# Patient Record
Sex: Male | Born: 1990 | Race: Black or African American | Hispanic: No | Marital: Single | Smoking: Never smoker
Health system: Southern US, Community
[De-identification: ages and names within clinical notes are randomized; demographics above are authoritative.]

---

## 2010-05-07 ENCOUNTER — Inpatient Hospital Stay (INDEPENDENT_AMBULATORY_CARE_PROVIDER_SITE_OTHER)
Admission: RE | Admit: 2010-05-07 | Discharge: 2010-05-07 | Disposition: A | Discharge status: Home / Self Care | Source: Ambulatory Visit | Attending: Family Medicine | Admitting: Family Medicine

## 2010-05-07 DIAGNOSIS — J029 Acute pharyngitis, unspecified: Secondary | ICD-10-CM

## 2010-05-07 DIAGNOSIS — J019 Acute sinusitis, unspecified: Secondary | ICD-10-CM

## 2012-11-29 ENCOUNTER — Ambulatory Visit
Admission: RE | Admit: 2012-11-29 | Discharge: 2012-11-29 | Disposition: A | Discharge status: Home / Self Care | Source: Ambulatory Visit | Attending: Emergency Medicine | Admitting: Emergency Medicine

## 2012-11-29 ENCOUNTER — Other Ambulatory Visit: Payer: Self-pay | Admitting: Emergency Medicine

## 2012-11-29 DIAGNOSIS — S93401A Sprain of unspecified ligament of right ankle, initial encounter: Secondary | ICD-10-CM

## 2015-05-31 IMAGING — CR DG ANKLE COMPLETE 3+V*R*
3 series · 3 of 3 positions shown · non-contrast
Comparison: None.

CLINICAL DATA: Twisting injury [REDACTED]. Medial pain.

EXAM:
RIGHT ANKLE - COMPLETE 3+ VIEW

[view not recorded (1 of 3)]
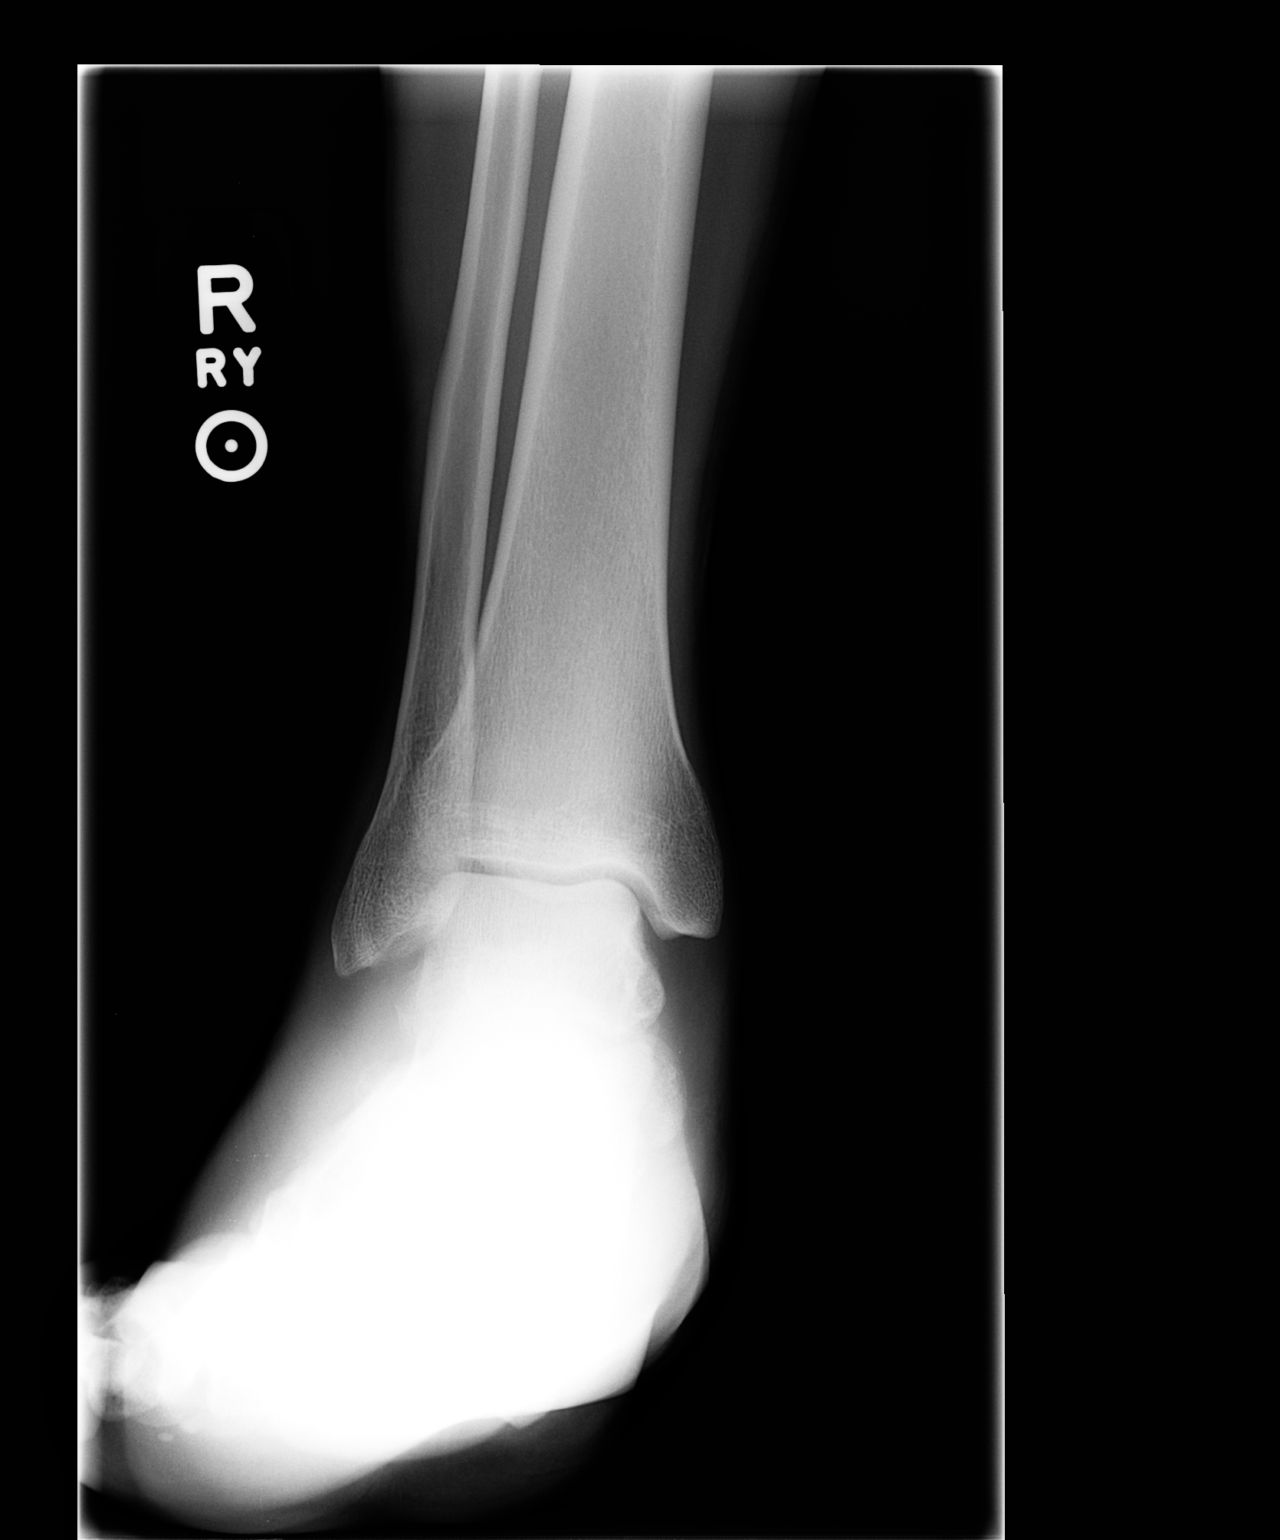

[view not recorded (2 of 3)]
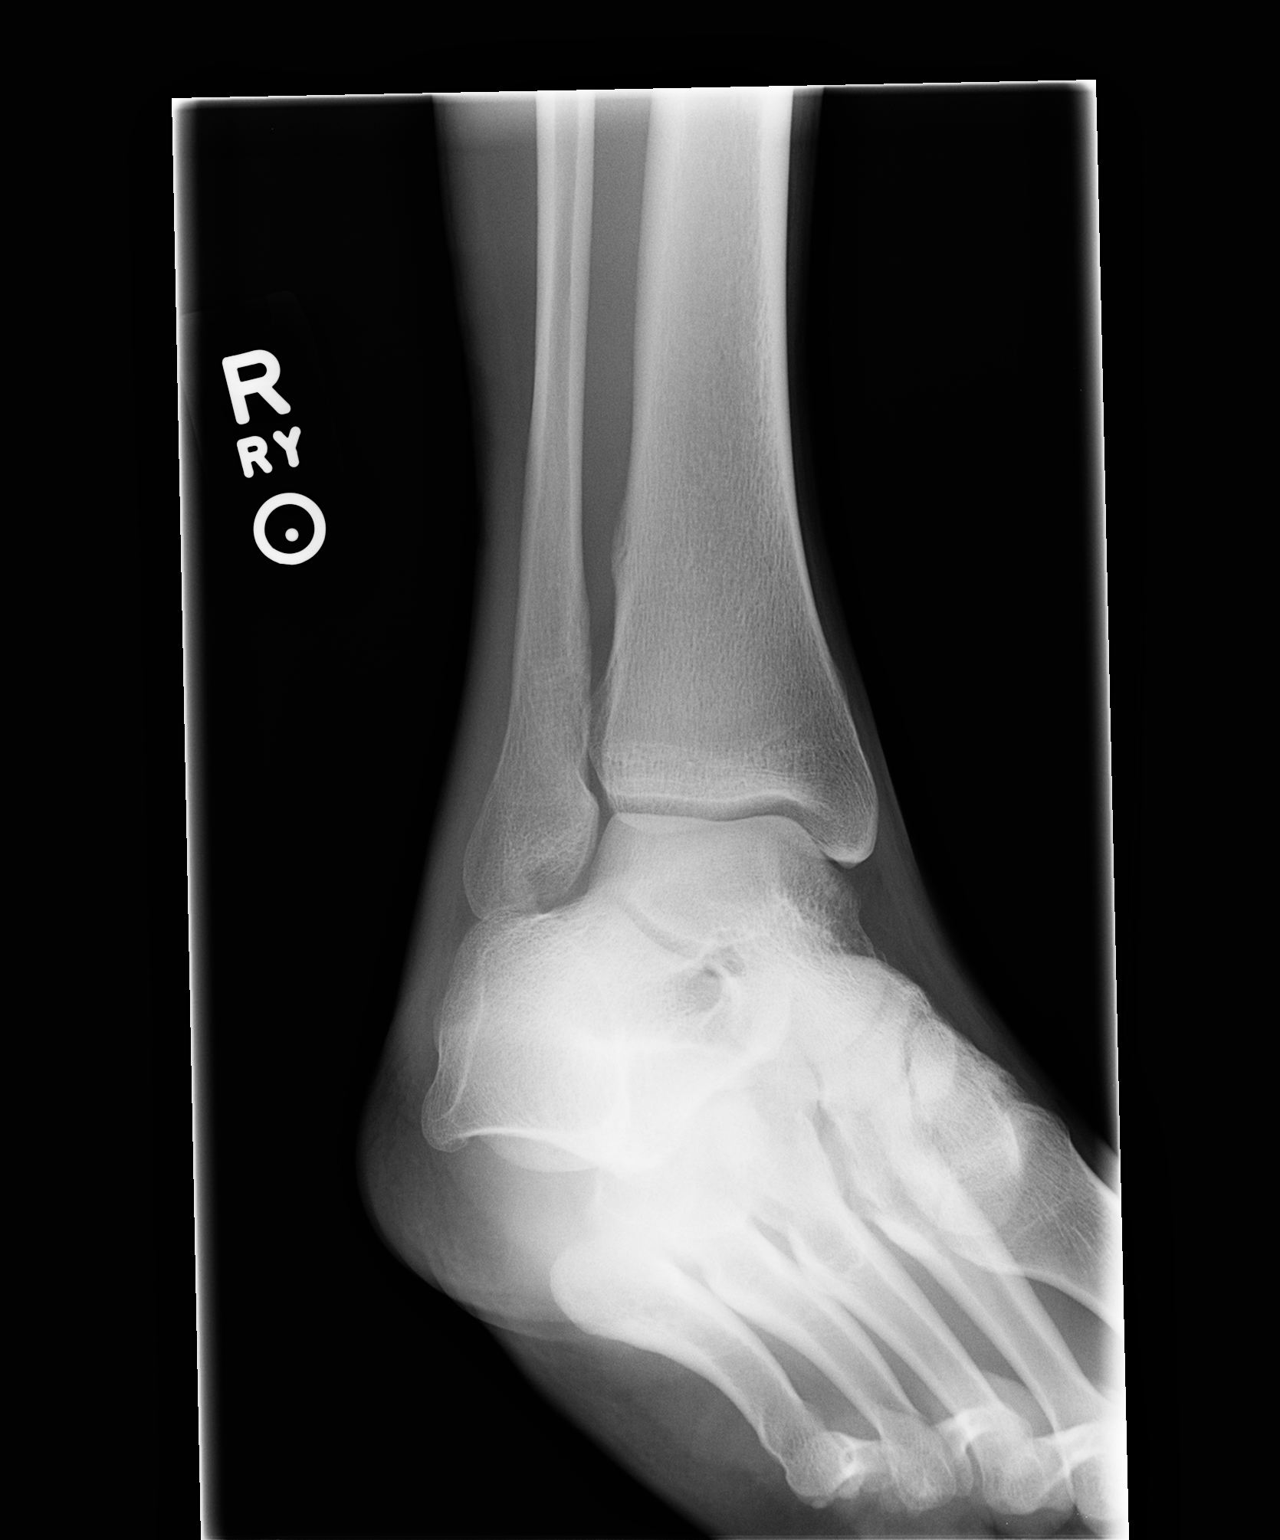

[view not recorded (3 of 3)]
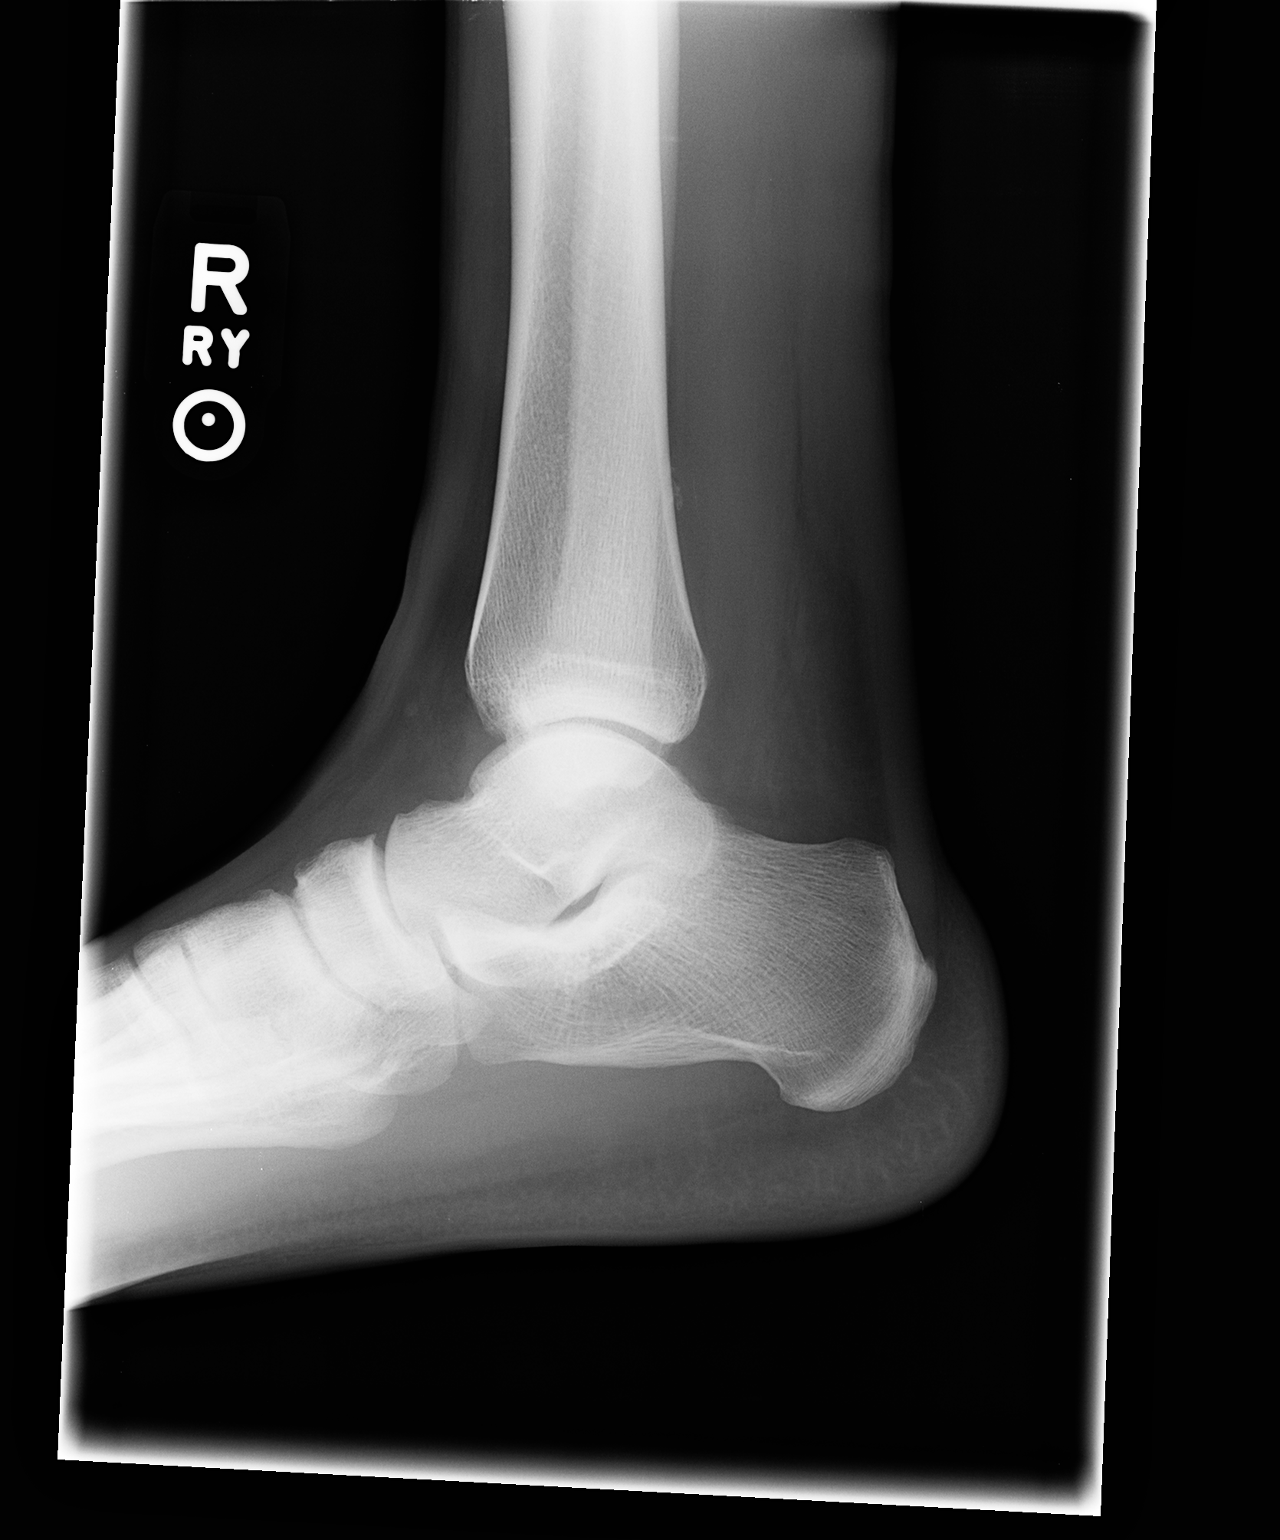

[3 of 3 positions shown; findings below may reference images not displayed]

FINDINGS: No acute fracture or dislocation. No significant soft tissue
swelling. Base of fifth metatarsal and talar dome intact.
IMPRESSION: No acute osseous abnormality.

## 2022-08-16 ENCOUNTER — Encounter (HOSPITAL_BASED_OUTPATIENT_CLINIC_OR_DEPARTMENT_OTHER): Payer: Self-pay | Admitting: Emergency Medicine

## 2022-08-16 ENCOUNTER — Emergency Department (HOSPITAL_BASED_OUTPATIENT_CLINIC_OR_DEPARTMENT_OTHER): Payer: BC Managed Care – PPO

## 2022-08-16 ENCOUNTER — Other Ambulatory Visit: Payer: Self-pay

## 2022-08-16 ENCOUNTER — Emergency Department (HOSPITAL_BASED_OUTPATIENT_CLINIC_OR_DEPARTMENT_OTHER)
Admission: EM | Admit: 2022-08-16 | Discharge: 2022-08-16 | Disposition: A | Discharge status: Home / Self Care | Payer: BC Managed Care – PPO | Attending: Emergency Medicine | Admitting: Emergency Medicine

## 2022-08-16 DIAGNOSIS — M545 Low back pain, unspecified: Secondary | ICD-10-CM | POA: Insufficient documentation

## 2022-08-16 MED ORDER — METHYLPREDNISOLONE 4 MG PO TBPK: ORAL_TABLET | ORAL | 0 refills | Status: AC

## 2022-08-16 MED ORDER — METHOCARBAMOL 500 MG PO TABS: 500.0000 mg | ORAL_TABLET | Freq: Two times a day (BID) | ORAL | 0 refills | Status: AC

## 2022-08-16 MED ORDER — KETOROLAC TROMETHAMINE 60 MG/2ML IM SOLN
60.0000 mg | Freq: Once | INTRAMUSCULAR | Status: AC
  Administered 2022-08-16: 60 mg via INTRAMUSCULAR
  Filled 2022-08-16: qty 2

## 2022-08-16 NOTE — Discharge Instructions (Signed)
I prescribed you Medrol Dosepak which are steroids to help with your pain.  I recommend also taking 1000 mg of Tylenol every 6 hours as needed for pain.  I have also prescribed you a muscle relaxant called Robaxin to take as needed.  This medication is sedating so please be careful with its use.  I have given you the number to follow-up with spine team if you are still not having good improvement.

## 2022-08-16 NOTE — ED Triage Notes (Signed)
Pt has started new weight lifting past month. Noticed back pain lingering,saw ortho, dx pulled muscle.took muscle relaxer. Pain has been getting worse. He went back to ortho today ,since he was complaining of his abdomen hurting, he should be seen in ED. Abdominal pain in pelvic region,  no issues with voiding.

## 2022-08-16 NOTE — ED Provider Notes (Signed)
[place] EMERGENCY DEPARTMENT AT [facility] Provider Note   CSN: [id number] Arrival date & time: 08/16/22  1112     History  Chief Complaint  Patient presents with   Back Pain    Nicholas Malone is a 32 y.o. male.  Patient here with low back pain.  Worse on the right than the left.  Treated for muscle spasm with muscle relaxants recently but felt little bit better then got worse again.  Now pain, radiating around to the abdomen especially on the right side.  Denies any loss of bowel or bladder.  Denies any chest pain or shortness of breath.  Denies any alcohol or drug use.  Denies any nausea vomiting diarrhea.  No abdominal surgery history.  The history is provided by the patient.       Home Medications Prior to Admission medications   Medication Sig Start Date End Date Taking? Authorizing Provider  methocarbamol (ROBAXIN) 500 MG tablet Take 1 tablet (500 mg total) by mouth 2 (two) times daily. 08/16/22  Yes Yasmene Salomone, DO  methylPREDNISolone (MEDROL DOSEPAK) 4 MG TBPK tablet Follow package insert 08/16/22  Yes Kiyoshi Schaab, DO      Allergies    Patient has no allergy information on record.    Review of Systems   Review of Systems  Physical Exam Updated Vital Signs BP 119/80 (BP Location: Right Arm)   Pulse (!) 58   Temp 98 F (36.7 C)   Resp 18   SpO2 100%  Physical Exam Vitals and nursing note reviewed.  Constitutional:      General: He is not in acute distress.    Appearance: He is well-developed. He is not ill-appearing.  HENT:     Head: Normocephalic and atraumatic.     Nose: Nose normal.     Mouth/Throat:     Mouth: Mucous membranes are moist.  Eyes:     Extraocular Movements: Extraocular movements intact.     Conjunctiva/sclera: Conjunctivae normal.     Pupils: Pupils are equal, round, and reactive to light.  Cardiovascular:     Rate and Rhythm: Normal rate and regular rhythm.     Pulses: Normal pulses.     Heart sounds: Normal  heart sounds. No murmur heard. Pulmonary:     Effort: Pulmonary effort is normal. No respiratory distress.     Breath sounds: Normal breath sounds.  Abdominal:     Palpations: Abdomen is soft.     Tenderness: There is abdominal tenderness. There is right CVA tenderness.     Comments: Some tenderness to the right side of the abdomen  Musculoskeletal:        General: Tenderness present. No swelling.     Cervical back: Normal range of motion and neck supple.     Comments: No midline spinal pain, tenderness to the right paraspinal lumbar muscles  Skin:    General: Skin is warm and dry.     Capillary Refill: Capillary refill takes less than 2 seconds.  Neurological:     General: No focal deficit present.     Mental Status: He is alert and oriented to person, place, and time.     Cranial Nerves: No cranial nerve deficit.     Sensory: No sensory deficit.     Motor: No weakness.     Coordination: Coordination normal.     Comments: 5+ out of 5 strength throughout, normal sensation  Psychiatric:        Mood and Affect: Mood  normal.     ED Results / Procedures / Treatments   Labs (all labs ordered are listed, but only abnormal results are displayed) Labs Reviewed - No data to display  EKG None  Radiology CT Renal Stone Study  Result Date: 08/16/2022 CLINICAL DATA:  Flank and abdominal pain for 1 week. Suspected kidney stone. EXAM: CT ABDOMEN AND PELVIS WITHOUT CONTRAST TECHNIQUE: Multidetector CT imaging of the abdomen and pelvis was performed following the standard protocol without IV contrast. RADIATION DOSE REDUCTION: This exam was performed according to the departmental dose-optimization program which includes automated exposure control, adjustment of the mA and/or kV according to patient size and/or use of iterative reconstruction technique. COMPARISON:  None Available. FINDINGS: Lower chest: No acute findings. Hepatobiliary: No mass visualized on this unenhanced exam. Gallbladder is  unremarkable. No evidence of biliary ductal dilatation. Pancreas: No mass or inflammatory process visualized on this unenhanced exam. Spleen:  Within normal limits in size. Adrenals/Urinary tract: No evidence of urolithiasis or hydronephrosis. Unremarkable unopacified urinary bladder. Stomach/Bowel: No evidence of obstruction, inflammatory process, or abnormal fluid collections. Normal appendix visualized. Vascular/Lymphatic: No pathologically enlarged lymph nodes identified. No evidence of abdominal aortic aneurysm. Reproductive:  No mass or other significant abnormality. Other:  None. Musculoskeletal:  No suspicious bone lesions identified. IMPRESSION: Negative. No evidence of urolithiasis, hydronephrosis, or other acute findings. Electronically Signed   By: Danae Orleans M.D.   On: 08/16/2022 14:14    Procedures Procedures    Medications Ordered in ED Medications  ketorolac (TORADOL) injection 60 mg (60 mg Intramuscular Given 08/16/22 1354)    ED Course/ Medical Decision Making/ A&P                             Medical Decision Making Amount and/or Complexity of Data Reviewed Radiology: ordered.  Risk Prescription drug management.   Nicholas Malone is here with right-sided back pain, abdominal pain.  Feels like he pulled a muscle a few weeks ago took a muscle relaxant with improvement.  Got worse the last few days.  Does manual labor job.  He has no symptoms to suggest cauda equina.  He has no fever or chills.  No pain with urination.  No kidney stone history.  He is neurovascular neuromuscular intact on exam.  He is a little bit tender in the right lower abdomen will get a CT renal study to evaluate for any intra-abdominal processes such as kidney stone, seems less likely to be bowel suction or appendicitis.  Pain is reproducible.  He has no midline spinal pain.  Has got normal strength and sensation and good pulses in his legs.  No concern for vascular or neurologic process.  Will get CT  scan as well as lumbar CT.  I do suspect probably a herniated disc or sciatica.  Will give Toradol and reevaluate.  CT scan did not show any evidence of kidney stone.  No bowel obstruction.  No appendicitis.  Overall we will continue to treat for likely pinched nerve/muscle spasm with Medrol Dosepak and Robaxin him follow-up with spine team if needed.  Discharged in good condition.  This chart was dictated using voice recognition software.  Despite best efforts to proofread,  errors can occur which can change the documentation meaning.         Final Clinical Impression(s) / ED Diagnoses Final diagnoses:  Acute right-sided low back pain, unspecified whether sciatica present    Rx / DC Orders  ED Discharge Orders          Ordered    methylPREDNISolone (MEDROL DOSEPAK) 4 MG TBPK tablet        08/16/22 1400    methocarbamol (ROBAXIN) 500 MG tablet  2 times daily        08/16/22 1400              Tiasha Helvie, DO 08/16/22 1420

## 2022-08-16 NOTE — ED Notes (Signed)
Dc instructions reviewed with patient. Patient voiced understanding. Dc with belongings.  °

## 2022-08-18 ENCOUNTER — Other Ambulatory Visit: Payer: Self-pay

## 2022-08-18 DIAGNOSIS — M5432 Sciatica, left side: Secondary | ICD-10-CM | POA: Diagnosis not present

## 2022-08-18 DIAGNOSIS — M5431 Sciatica, right side: Secondary | ICD-10-CM | POA: Diagnosis not present

## 2022-08-18 DIAGNOSIS — M545 Low back pain, unspecified: Secondary | ICD-10-CM | POA: Diagnosis present

## 2022-08-18 MED ORDER — OXYCODONE-ACETAMINOPHEN 5-325 MG PO TABS
1.0000 | ORAL_TABLET | ORAL | Status: DC | PRN
  Administered 2022-08-19: 1 via ORAL
  Filled 2022-08-18: qty 1

## 2022-08-18 NOTE — ED Triage Notes (Signed)
Ongoing issue with pinched nerved in lower back (seen [facility] Sat). Has been taking tylenol, robaxin, and methlprednisolone. Comes in today for unbearable pain in lower back and radiation into both legs.

## 2022-08-19 ENCOUNTER — Emergency Department (HOSPITAL_BASED_OUTPATIENT_CLINIC_OR_DEPARTMENT_OTHER)
Admission: EM | Admit: 2022-08-19 | Discharge: 2022-08-19 | Disposition: A | Discharge status: Home / Self Care | Payer: BC Managed Care – PPO | Attending: Emergency Medicine | Admitting: Emergency Medicine

## 2022-08-19 ENCOUNTER — Emergency Department (HOSPITAL_COMMUNITY): Payer: BC Managed Care – PPO

## 2022-08-19 ENCOUNTER — Telehealth: Payer: Self-pay | Admitting: *Deleted

## 2022-08-19 DIAGNOSIS — M5431 Sciatica, right side: Secondary | ICD-10-CM

## 2022-08-19 DIAGNOSIS — M5432 Sciatica, left side: Secondary | ICD-10-CM | POA: Diagnosis not present

## 2022-08-19 DIAGNOSIS — M545 Low back pain, unspecified: Secondary | ICD-10-CM | POA: Diagnosis present

## 2022-08-19 MED ORDER — FENTANYL CITRATE PF 50 MCG/ML IJ SOSY
50.0000 ug | PREFILLED_SYRINGE | Freq: Once | INTRAMUSCULAR | Status: AC
  Administered 2022-08-19: 50 ug via INTRAVENOUS
  Filled 2022-08-19: qty 1

## 2022-08-19 MED ORDER — DICLOFENAC EPOLAMINE 1.3 % EX PTCH
1.0000 | MEDICATED_PATCH | Freq: Two times a day (BID) | CUTANEOUS | Status: DC
  Filled 2022-08-19 (×2): qty 1

## 2022-08-19 MED ORDER — PREDNISONE 20 MG PO TABS: 60.0000 mg | ORAL_TABLET | ORAL | Status: DC

## 2022-08-19 MED ORDER — DIAZEPAM 5 MG/ML IJ SOLN
5.0000 mg | Freq: Once | INTRAMUSCULAR | Status: AC
  Administered 2022-08-19: 5 mg via INTRAMUSCULAR
  Filled 2022-08-19: qty 2

## 2022-08-19 MED ORDER — HYDROCODONE-ACETAMINOPHEN 5-325 MG PO TABS: 1.0000 | ORAL_TABLET | Freq: Four times a day (QID) | ORAL | 0 refills | Status: AC | PRN

## 2022-08-19 MED ORDER — KETOROLAC TROMETHAMINE 30 MG/ML IJ SOLN
30.0000 mg | Freq: Once | INTRAMUSCULAR | Status: AC
  Administered 2022-08-19: 30 mg via INTRAMUSCULAR
  Filled 2022-08-19: qty 1

## 2022-08-19 NOTE — ED Provider Notes (Signed)
Patient seen on arrival after transfer to this facility for MRI due to back pain.  He notes his pain is currently controlled.  MRI pending.  11:31 AM On return from MRI the patient is awake, alert, in no distress.  I have reviewed his imaging, nerve root impingement S1.  Patient started on topical and oral medications, will follow-up with orthopedics.   Gerhard Munch, MD 08/19/22 1131

## 2022-08-19 NOTE — Telephone Encounter (Signed)
Pt and girlfriend called regarding which pharmacy Rx was e-scribed to.  RNCM reviewed chart to access After Visit Summary and found that Rx was sent to [facility] on [address].  Girlfriend realized that she was at [facility].  RNCM   advised her to pick up across the street at her convenience.

## 2022-08-19 NOTE — ED Notes (Signed)
Per secretary Nehemiah Settle, [facility] called

## 2022-08-19 NOTE — ED Provider Notes (Signed)
[place] EMERGENCY DEPARTMENT AT [facility]  Provider Note  CSN: [id number] Arrival date & time: 08/18/22 2343  History Chief Complaint  Patient presents with   Back Pain    Nicholas Malone is a 32 y.o. male with no significant PMH reports he has had intermittent low back pain for about 2 weeks. Initially seen in the [facility] walk in clinic and diagnosed with muscle spasm. Given Rx for muscle relaxer. Pain returned 2 days ago, seen in the ED and had labs and CT neg for stones. There was a possible bulging disc on CT. He was given Toradol while here as well as medrol and robaxin. He state he was feeling better during the day and able to get around the house but he bent over to pick something up and felt his back go out again. He reports severe, bilateral R>L low back pain, radiating down buttocks into posterior thighs. No numbness or tingling. No weakness. No incontinence of bowels or bladder. No fever or history of drug use. He was given Percocet in triage with minimal improvement.    Home Medications Prior to Admission medications   Medication Sig Start Date End Date Taking? Authorizing Provider  methocarbamol (ROBAXIN) 500 MG tablet Take 1 tablet (500 mg total) by mouth 2 (two) times daily. 08/16/22   Virgina Norfolk, DO  methylPREDNISolone (MEDROL DOSEPAK) 4 MG TBPK tablet Follow package insert 08/16/22   Virgina Norfolk, DO     Allergies    Patient has no known allergies.   Review of Systems   Review of Systems Please see HPI for pertinent positives and negatives  Physical Exam BP 111/79 (BP Location: Right Arm)   Pulse 71   Temp 97.8 F (36.6 C)   Resp (!) 22   Ht 6\' 5"  (1.956 m)   Wt 99.8 kg   SpO2 100%   BMI 26.09 kg/m   Physical Exam Vitals and nursing note reviewed.  Constitutional:      Comments: Lying prone on bed  HENT:     Head: Normocephalic.     Nose: Nose normal.  Eyes:     Extraocular Movements: Extraocular movements intact.   Pulmonary:     Effort: Pulmonary effort is normal.  Musculoskeletal:        General: Tenderness (diffuse soft tissue tenderness of lower back and bilateral sciatic notches) present. Normal range of motion.     Cervical back: Neck supple.     Comments: Patient lying prone, exam is limited  Skin:    Findings: No rash (on exposed skin).  Neurological:     General: No focal deficit present.     Mental Status: He is alert and oriented to person, place, and time.     Sensory: No sensory deficit.     Motor: No weakness.  Psychiatric:        Mood and Affect: Mood normal.     ED Results / Procedures / Treatments   EKG None  Procedures Procedures  Medications Ordered in the ED Medications  oxyCODONE-acetaminophen (PERCOCET/ROXICET) 5-325 MG per tablet 1 tablet (1 tablet Oral Given 08/19/22 0004)  fentaNYL (SUBLIMAZE) injection 50 mcg (has no administration in time range)  ketorolac (TORADOL) 30 MG/ML injection 30 mg (30 mg Intramuscular Given 08/19/22 0402)  diazepam (VALIUM) injection 5 mg (5 mg Intramuscular Given 08/19/22 0401)    Initial Impression and Plan  Patient here with persistent low back pain. Some radicular features now not previously present. No red flags or  concern for any acute surgical process such as cauda equina or epidural abscess. Will give IM toradol and valium and reassess.   ED Course   Clinical Course as of 08/19/22 0615  Tue Aug 19, 2022  1610 Patient initially thought he was doing better, but when he tried to turn over supine, his back spasmed again and he was unable to sit or move. Given his persistent symptoms and repeat ED visit, will send to [facility] for MRI. Dr. Jacqulyn Bath accepting. Additional pain medications ordered [CS]    Clinical Course User Index [CS] Pollyann Savoy, MD     MDM Rules/Calculators/A&P Medical Decision Making Problems Addressed: Bilateral sciatica: acute illness or injury  Amount and/or Complexity of Data Reviewed Radiology:  ordered.  Risk Prescription drug management. Parenteral controlled substances.     Final Clinical Impression(s) / ED Diagnoses Final diagnoses:  Bilateral sciatica    Rx / DC Orders ED Discharge Orders     None        Pollyann Savoy, MD 08/19/22 [phone]

## 2022-08-19 NOTE — Discharge Instructions (Signed)
As discussed, your evaluation today and MRI demonstrate impingement of one of your nerve roots.  This is similar to sciatica.  Please take all medication as previously prescribed, including the Robaxin and methylprednisolone.  In addition, obtain and use medicated patches including the ingredient methyl salicylate.  [employer] is 1 manufacture of this product. If that medication is not tolerable, use Voltaren gel as a substitute.  Typically this type of pain improves with medication, therapy.  Please follow-up with our orthopedic colleagues for appropriate ongoing outpatient care.

## 2022-09-18 ENCOUNTER — Other Ambulatory Visit: Payer: Self-pay

## 2022-09-18 ENCOUNTER — Ambulatory Visit: Payer: BC Managed Care – PPO | Attending: Neurological Surgery

## 2022-09-18 DIAGNOSIS — M5459 Other low back pain: Secondary | ICD-10-CM | POA: Insufficient documentation

## 2022-09-18 DIAGNOSIS — M6281 Muscle weakness (generalized): Secondary | ICD-10-CM | POA: Diagnosis present

## 2022-09-18 NOTE — Therapy (Signed)
OUTPATIENT PHYSICAL THERAPY THORACOLUMBAR EVALUATION   Patient Name: Nicholas Malone MRN: [id number] DOB:01/09/1991, 32 y.o., male Today's Date: 09/18/2022  END OF SESSION:  PT End of Session - 09/18/22 1653     Visit Number 1    Number of Visits 7    Date for PT Re-Evaluation 10/30/22    Authorization Type BCBS    PT Start Time 1605    PT Stop Time 1635    PT Time Calculation (min) 30 min    Activity Tolerance Patient tolerated treatment well    Behavior During Therapy [facility] for tasks assessed/performed             History reviewed. No pertinent past medical history. History reviewed. No pertinent surgical history. There are no problems to display for this patient.   PCP: No PCP  REFERRING PROVIDER: Arman Bogus, MD   REFERRING DIAG: M51.26 (ICD-10-CM) - Other intervertebral disc displacement, lumbar region   Rationale for Evaluation and Treatment: Rehabilitation  THERAPY DIAG:  Other low back pain - Plan: PT plan of care cert/re-cert  Muscle weakness (generalized) - Plan: PT plan of care cert/re-cert  ONSET DATE: 2 months  SUBJECTIVE:                                                                                                                                                                                           SUBJECTIVE STATEMENT: Pt presents to PT with reports of subacute LBP with referral into bilateral LE. Notes N/T down posterior thighs bilaterally made worse with forward bending. Denies bowel/bladder changes or saddle anesthesia. Has been doing lighter duty at work.   PERTINENT HISTORY:  None  PAIN:  Are you having pain?  Yes: NPRS scale: 1/10 Worst: 4/10 Pain location: lower back, bilaterally Pain description: sharp, N/T Aggravating factors: fwd bending, lying on side Relieving factors: heat, positioning, medication  PRECAUTIONS: None  WEIGHT BEARING RESTRICTIONS: No  FALLS:  Has patient fallen in last 6 months? No  LIVING  ENVIRONMENT: Lives with: lives alone Lives in: House/apartment  OCCUPATION: Art therapist for [employer]   PLOF: Independent  PATIENT GOALS: decrease back pain for improving comfort with work   OBJECTIVE:   DIAGNOSTIC FINDINGS (from chart):  CLINICAL DATA:  Lumbar radiculopathy with symptoms persisting over 6 weeks of treatment.   IMPRESSION: L5-S1 small right paracentral extrusion impinging on the right S1 nerve root.  PATIENT SURVEYS:  FOTO: 72% function; 83% predicted  COGNITION: Overall cognitive status: Within functional limits for tasks assessed     SENSATION: Light touch: Impaired -   POSTURE: increased lumbar lordosis  PALPATION: No overt TTP to  lumbar parapsinals  LUMBAR ROM:   AROM eval  Flexion 25% reduced with pain  Extension WNL  Right lateral flexion   Left lateral flexion   Right rotation WNL  Left rotation pain   (Blank rows = not tested)  LOWER EXTREMITY MMT:    MMT Right eval Left eval  Hip flexion 4/5 4/5  Hip extension    Hip abduction 4/5 4/5  Hip adduction    Hip internal rotation    Hip external rotation    Knee flexion    Knee extension    Ankle dorsiflexion    Ankle plantarflexion    Ankle inversion    Ankle eversion     (Blank rows = not tested)  LUMBAR SPECIAL TESTS:  Straight leg raise test: Positive and Slump test: Positive  FUNCTIONAL TESTS:  90/90 hold: 35 seconds  GAIT: Distance walked: 41ft Assistive device utilized: None Level of assistance: Complete Independence Comments: no deviatoins  TREATMENT: [facility] Adult PT Treatment:                                                DATE: 09/18/2022 Therapeutic Exercise: POE x 60"  S/L hip x 5 Supine PPT x 5 - 5" hold 90/90 hold x 30"  Modified side plank x 30" Seated sciatica x 5 each   PATIENT EDUCATION:  Education details: eval findings, FOTO, HEP, POC Person educated: Patient Education method: Explanation, Demonstration, and Handouts Education comprehension:  verbalized understanding and returned demonstration  HOME EXERCISE PROGRAM: Access Code: [email or web address] URL: [email or web address] Date: 09/18/2022 Prepared by: Edwinna Areola  Exercises - Static Prone on Elbows  - 1 x daily - 7 x weekly - 2 reps - 2 min hold - Sidelying Hip Abduction  - 1 x daily - 7 x weekly - 3 sets - 10 reps - Supine Posterior Pelvic Tilt  - 1 x daily - 7 x weekly - 2 sets - 10 reps - 5 sec hold - Supine 90/90 Abdominal Bracing  - 1 x daily - 7 x weekly - 2 reps - 30 sec hold - Side Plank on Knees  - 1 x daily - 7 x weekly - 2 reps - 30 sec hold - Seated Sciatic Tensioner  - 1 x daily - 7 x weekly - 2 sets - 10 reps  ASSESSMENT:  CLINICAL IMPRESSION: Patient is a 32 y.o. M who was seen today for physical therapy evaluation and treatment for subacute lower back pain with referral into bilateral LE. Physical findings are consistent with MD impression as pt demonstrates decrease in functional mobility and weakness in core/proximal hip. His FOTO score shows decrease in subjective functoinal ability below PLOF. Pt would benefit from skilled PT services working on improving strength and reducing neural tension.   OBJECTIVE IMPAIRMENTS: decreased mobility, decreased strength, and pain.   ACTIVITY LIMITATIONS: standing and squatting  PARTICIPATION LIMITATIONS: community activity, occupation, and yard work  PERSONAL FACTORS: None  REHAB POTENTIAL: Excellent  CLINICAL DECISION MAKING: Stable/uncomplicated  EVALUATION COMPLEXITY: Low   GOALS: Goals reviewed with patient? No  SHORT TERM GOALS: Target date: 10/09/2022   Pt will be compliant and knowledgeable with initial HEP for improved comfort and carryover Baseline: initial HEP given  Goal status: INITIAL  2.  Pt will self report lower back pain no greater than 2/10 for improved comfort and functional ability Baseline:  4/10 at worst Goal status: INITIAL   LONG TERM GOALS: Target date: 11/13/2022   Pt  will improve FOTO function score to no less than 83% as proxy for functional improvement Baseline: 72% function Goal status: INITIAL   2.  Pt will self report lower back and bilateral LE pain no greater than 0/10 for improved comfort and functional ability Baseline: 4/10 at worst Goal status: INITIAL   3.  Pt will increase 90/90 hold time to no less than 60 seconds for improved core endurance and functional stability Baseline: 35 seconds Goal status: INITIAL   4.  Pt will improve bilateral LE MMT to no less than 5/5 for all tested motions for improved functional ability Baseline: see MMT chart Goal status: INITIAL   PLAN:  PT FREQUENCY: 1x/week  PT DURATION: 6 weeks  PLANNED INTERVENTIONS: Therapeutic exercises, Therapeutic activity, Neuromuscular re-education, Balance training, Gait training, Patient/Family education, Self Care, Joint mobilization, Dry Needling, Electrical stimulation, Cryotherapy, Moist heat, Vasopneumatic device, Manual therapy, and Re-evaluation.  PLAN FOR NEXT SESSION: assess HEP response, core and hip strengthening   Eloy End, PT 09/18/2022, 4:56 PM

## 2022-09-30 ENCOUNTER — Ambulatory Visit: Payer: BC Managed Care – PPO

## 2022-10-06 NOTE — Therapy (Unsigned)
OUTPATIENT PHYSICAL THERAPY THORACOLUMBAR EVALUATION   Patient Name: Nicholas Malone MRN: [id number] DOB:[birth year], 32 y.o., male Today's Date: 10/06/2022  END OF SESSION:    No past medical history on file. No past surgical history on file. There are no problems to display for this patient.   PCP: No PCP  REFERRING PROVIDER: Arman Bogus, MD   REFERRING DIAG: M51.26 (ICD-10-CM) - Other intervertebral disc displacement, lumbar region   Rationale for Evaluation and Treatment: Rehabilitation  THERAPY DIAG:  No diagnosis found.  ONSET DATE: 2 months  SUBJECTIVE:                                                                                                                                                                                           SUBJECTIVE STATEMENT: Pt presents to PT with reports of subacute LBP with referral into bilateral LE. Notes N/T down posterior thighs bilaterally made worse with forward bending. Denies bowel/bladder changes or saddle anesthesia. Has been doing lighter duty at work.   PERTINENT HISTORY:  None  PAIN:  Are you having pain?  Yes: NPRS scale: 1/10 Worst: 4/10 Pain location: lower back, bilaterally Pain description: sharp, N/T Aggravating factors: fwd bending, lying on side Relieving factors: heat, positioning, medication  PRECAUTIONS: None  WEIGHT BEARING RESTRICTIONS: No  FALLS:  Has patient fallen in last 6 months? 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   IMPRESSION: L5-S1 small right paracentral extrusion impinging on the right S1 nerve root.  PATIENT SURVEYS:  FOTO: 72% function; 83%  predicted  COGNITION: Overall cognitive status: Within functional limits for tasks assessed     SENSATION: Light touch: Impaired -   POSTURE: increased lumbar lordosis  PALPATION: No overt TTP to lumbar parapsinals  LUMBAR ROM:   AROM eval  Flexion 25% reduced with pain  Extension WNL  Right lateral flexion   Left lateral flexion   Right rotation WNL  Left rotation pain   (Blank rows = not tested)  LOWER EXTREMITY MMT:    MMT Right eval Left eval  Hip flexion 4/5 4/5  Hip extension    Hip abduction 4/5 4/5  Hip adduction    Hip internal rotation    Hip external rotation    Knee flexion    Knee extension    Ankle dorsiflexion    Ankle plantarflexion  Ankle inversion    Ankle eversion     (Blank rows = not tested)  LUMBAR SPECIAL TESTS:  Straight leg raise test: Positive and Slump test: Positive  FUNCTIONAL TESTS:  90/90 hold: 35 seconds  GAIT: Distance walked: 63ft Assistive device utilized: None Level of assistance: Complete Independence Comments: no deviatoins  TREATMENT: [facility] Adult PT Treatment:                                                DATE: 09/18/2022 Therapeutic Exercise: POE x 60"  S/L hip x 5 Supine PPT x 5 - 5" hold 90/90 hold x 30"  Modified side plank x 30" Seated sciatica x 5 each   PATIENT EDUCATION:  Education details: eval findings, FOTO, HEP, POC Person educated: Patient Education method: Explanation, Demonstration, and Handouts Education comprehension: verbalized understanding and returned demonstration  HOME EXERCISE PROGRAM: Access Code: [email or web address] URL: [email or web address] Date: 09/18/2022 Prepared by: Edwinna Areola  Exercises - Static Prone on Elbows  - 1 x daily - 7 x weekly - 2 reps - 2 min hold - Sidelying Hip Abduction  - 1 x daily - 7 x weekly - 3 sets - 10 reps - Supine Posterior Pelvic Tilt  - 1 x daily - 7 x weekly - 2 sets - 10 reps - 5 sec hold - Supine 90/90 Abdominal Bracing  - 1 x daily  - 7 x weekly - 2 reps - 30 sec hold - Side Plank on Knees  - 1 x daily - 7 x weekly - 2 reps - 30 sec hold - Seated Sciatic Tensioner  - 1 x daily - 7 x weekly - 2 sets - 10 reps  ASSESSMENT:  CLINICAL IMPRESSION: Patient is a 31 y.o. M who was seen today for physical therapy evaluation and treatment for subacute lower back pain with referral into bilateral LE. Physical findings are consistent with MD impression as pt demonstrates decrease in functional mobility and weakness in core/proximal hip. His FOTO score shows decrease in subjective functoinal ability below PLOF. Pt would benefit from skilled PT services working on improving strength and reducing neural tension.   OBJECTIVE IMPAIRMENTS: decreased mobility, decreased strength, and pain.   ACTIVITY LIMITATIONS: standing and squatting  PARTICIPATION LIMITATIONS: community activity, occupation, and yard work  PERSONAL FACTORS: None  REHAB POTENTIAL: Excellent  CLINICAL DECISION MAKING: Stable/uncomplicated  EVALUATION COMPLEXITY: Low   GOALS: Goals reviewed with patient? No  SHORT TERM GOALS: Target date: 10/09/2022   Pt will be compliant and knowledgeable with initial HEP for improved comfort and carryover Baseline: initial HEP given  Goal status: INITIAL  2.  Pt will self report lower back pain no greater than 2/10 for improved comfort and functional ability Baseline: 4/10 at worst Goal status: INITIAL   LONG TERM GOALS: Target date: 11/13/2022   Pt will improve FOTO function score to no less than 83% as proxy for functional improvement Baseline: 72% function Goal status: INITIAL   2.  Pt will self report lower back and bilateral LE pain no greater than 0/10 for improved comfort and functional ability Baseline: 4/10 at worst Goal status: INITIAL   3.  Pt will increase 90/90 hold time to no less than 60 seconds for improved core endurance and functional stability Baseline: 35 seconds Goal status: INITIAL   4.  Pt  will improve bilateral LE  MMT to no less than 5/5 for all tested motions for improved functional ability Baseline: see MMT chart Goal status: INITIAL   PLAN:  PT FREQUENCY: 1x/week  PT DURATION: 6 weeks  PLANNED INTERVENTIONS: Therapeutic exercises, Therapeutic activity, Neuromuscular re-education, Balance training, Gait training, Patient/Family education, Self Care, Joint mobilization, Dry Needling, Electrical stimulation, Cryotherapy, Moist heat, Vasopneumatic device, Manual therapy, and Re-evaluation.  PLAN FOR NEXT SESSION: assess HEP response, core and hip strengthening   Hildred Laser, PT 10/06/2022, 9:47 AM

## 2022-10-08 ENCOUNTER — Ambulatory Visit: Payer: BC Managed Care – PPO

## 2022-10-08 DIAGNOSIS — M6281 Muscle weakness (generalized): Secondary | ICD-10-CM

## 2022-10-08 DIAGNOSIS — M5459 Other low back pain: Secondary | ICD-10-CM | POA: Diagnosis not present

## 2022-10-08 NOTE — Therapy (Signed)
OUTPATIENT PHYSICAL THERAPY TREATMENT   Patient Name: Nicholas Malone MRN: [id number] DOB:[birth year], 32 y.o., male Today's Date: 10/08/2022  END OF SESSION:  PT End of Session - 10/08/22 1613     Visit Number 2    Number of Visits 7    Date for PT Re-Evaluation 10/30/22    Authorization Type BCBS    PT Start Time 1615    PT Stop Time 1655    PT Time Calculation (min) 40 min    Activity Tolerance Patient tolerated treatment well    Behavior During Therapy [facility] for tasks assessed/performed              History reviewed. No pertinent past medical history. History reviewed. No pertinent surgical history. There are no problems to display for this patient.   PCP: No PCP  REFERRING PROVIDER: Arman Bogus, MD   REFERRING DIAG: M51.26 (ICD-10-CM) - Other intervertebral disc displacement, lumbar region   Rationale for Evaluation and Treatment: Rehabilitation  THERAPY DIAG:  Other low back pain  Muscle weakness (generalized)  ONSET DATE: 2 months  SUBJECTIVE:                                                                                                                                                                                           SUBJECTIVE STATEMENT: Pt presents to PT with reports of decreased LBP. Has been compliant with HEP.   PERTINENT HISTORY:  None  PAIN:  Are you having pain?  Yes: NPRS scale: 1/10 Worst: 4/10 Pain location: lower back, bilaterally Pain description: sharp, N/T Aggravating factors: fwd bending, lying on side Relieving factors: heat, positioning, medication  PRECAUTIONS: None  WEIGHT BEARING RESTRICTIONS: No  FALLS:  Has patient fallen in last 6 months? 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   IMPRESSION: L5-S1 small right paracentral extrusion impinging on the right S1 nerve root.  PATIENT SURVEYS:  FOTO: 72% function; 83% predicted  COGNITION: Overall cognitive status: Within functional limits for tasks assessed     SENSATION: Light touch: Impaired -   POSTURE: increased lumbar lordosis  PALPATION: No overt TTP to lumbar parapsinals  LUMBAR ROM:   AROM eval  Flexion 25% reduced with pain  Extension WNL  Right lateral flexion   Left lateral flexion   Right rotation WNL  Left rotation pain   (  Blank rows = not tested)  LOWER EXTREMITY MMT:    MMT Right eval Left eval  Hip flexion 4/5 4/5  Hip extension    Hip abduction 4/5 4/5  Hip adduction    Hip internal rotation    Hip external rotation    Knee flexion    Knee extension    Ankle dorsiflexion    Ankle plantarflexion    Ankle inversion    Ankle eversion     (Blank rows = not tested)  LUMBAR SPECIAL TESTS:  Straight leg raise test: Positive and Slump test: Positive  FUNCTIONAL TESTS:  90/90 hold: 35 seconds  GAIT: Distance walked: 72ft Assistive device utilized: None Level of assistance: Complete Independence Comments: no deviatoins  TREATMENT: [facility] Adult PT Treatment:                                                DATE: 10/08/2022 Therapeutic Exercise: Supine sciatic nerve glide x 15 each Bridge 2x15 blue band S/L clamshell 2x10 blue band POE x 2 min Prone press up x 10 Supine PPT x 10 - 5" hold 90/90 2x30" 90/90 heel taps 2x5 each  Birddog 3x10 Pallof press 2x10 10#  [facility] Adult PT Treatment:                                                DATE: 09/18/2022 Therapeutic Exercise: POE x 60"  S/L hip x 5 Supine PPT x 5 - 5" hold 90/90 hold x 30"  Modified side plank x 30" Seated sciatica x 5 each   PATIENT EDUCATION:  Education details: eval findings, FOTO, HEP, POC Person educated:  Patient Education method: Explanation, Demonstration, and Handouts Education comprehension: verbalized understanding and returned demonstration  HOME EXERCISE PROGRAM: Access Code: [email or web address] URL: [email or web address] Date: 09/18/2022 Prepared by: Edwinna Areola  Exercises - Static Prone on Elbows  - 1 x daily - 7 x weekly - 2 reps - 2 min hold - Sidelying Hip Abduction  - 1 x daily - 7 x weekly - 3 sets - 10 reps - Supine Posterior Pelvic Tilt  - 1 x daily - 7 x weekly - 2 sets - 10 reps - 5 sec hold - Supine 90/90 Abdominal Bracing  - 1 x daily - 7 x weekly - 2 reps - 30 sec hold - Side Plank on Knees  - 1 x daily - 7 x weekly - 2 reps - 30 sec hold - Seated Sciatic Tensioner  - 1 x daily - 7 x weekly - 2 sets - 10 reps  ASSESSMENT:  CLINICAL IMPRESSION: Pt was able to complete all prescribed exercises with no adverse effect. Therapy today focused on improving core and proximal hip strength and extension based stretches for decreasing back pain. Pt is progressing well with therapy, will continue per POC.   OBJECTIVE IMPAIRMENTS: decreased mobility, decreased strength, and pain.   ACTIVITY LIMITATIONS: standing and squatting  PARTICIPATION LIMITATIONS: community activity, occupation, and yard work  PERSONAL FACTORS: None  GOALS: Goals reviewed with patient? No  SHORT TERM GOALS: Target date: 10/09/2022   Pt will be compliant and knowledgeable with initial HEP for improved comfort and carryover Baseline: initial HEP given  Goal status: INITIAL  2.  Pt will self report lower back pain no greater than 2/10 for improved comfort and functional ability Baseline: 4/10 at worst Goal status: INITIAL   LONG TERM GOALS: Target date: 11/13/2022   Pt will improve FOTO function score to no less than 83% as proxy for functional improvement Baseline: 72% function Goal status: INITIAL   2.  Pt will self report lower back and bilateral LE pain no greater than 0/10 for  improved comfort and functional ability Baseline: 4/10 at worst Goal status: INITIAL   3.  Pt will increase 90/90 hold time to no less than 60 seconds for improved core endurance and functional stability Baseline: 35 seconds Goal status: INITIAL   4.  Pt will improve bilateral LE MMT to no less than 5/5 for all tested motions for improved functional ability Baseline: see MMT chart Goal status: INITIAL   PLAN:  PT FREQUENCY: 1x/week  PT DURATION: 6 weeks  PLANNED INTERVENTIONS: Therapeutic exercises, Therapeutic activity, Neuromuscular re-education, Balance training, Gait training, Patient/Family education, Self Care, Joint mobilization, Dry Needling, Electrical stimulation, Cryotherapy, Moist heat, Vasopneumatic device, Manual therapy, and Re-evaluation.  PLAN FOR NEXT SESSION: assess HEP response, core and hip strengthening   Eloy End, PT 10/08/2022, 5:25 PM

## 2022-10-28 ENCOUNTER — Ambulatory Visit: Payer: BC Managed Care – PPO | Attending: Neurological Surgery

## 2022-10-28 DIAGNOSIS — M6281 Muscle weakness (generalized): Secondary | ICD-10-CM | POA: Diagnosis present

## 2022-10-28 DIAGNOSIS — M5459 Other low back pain: Secondary | ICD-10-CM | POA: Diagnosis present

## 2022-10-28 NOTE — Therapy (Signed)
OUTPATIENT PHYSICAL THERAPY TREATMENT/DISCHARGE  PHYSICAL THERAPY DISCHARGE SUMMARY  Visits from Start of Care: 3  Current functional level related to goals / functional outcomes: See goals and objective   Remaining deficits: See goals and objective   Education / Equipment: HEP   Patient agrees to discharge. Patient goals were met. Patient is being discharged due to meeting the stated rehab goals.   Patient Name: Nicholas Malone MRN: [id number] DOB:[birth year], 32 y.o., male Today's Date: 10/28/2022  END OF SESSION:  PT End of Session - 10/28/22 0848     Visit Number 3    Number of Visits 7    Date for PT Re-Evaluation 10/30/22    Authorization Type BCBS    PT Start Time 0848    PT Stop Time 0915    PT Time Calculation (min) 27 min    Activity Tolerance Patient tolerated treatment well    Behavior During Therapy [facility] for tasks assessed/performed               History reviewed. No pertinent past medical history. History reviewed. No pertinent surgical history. There are no problems to display for this patient.   PCP: No PCP  REFERRING PROVIDER: Arman Bogus, MD   REFERRING DIAG: M51.26 (ICD-10-CM) - Other intervertebral disc displacement, lumbar region   Rationale for Evaluation and Treatment: Rehabilitation  THERAPY DIAG:  Other low back pain  Muscle weakness (generalized)  ONSET DATE: 2 months  SUBJECTIVE:                                                                                                                                                                                           SUBJECTIVE STATEMENT: Pt presents to PT with no current reports of pain or N/T in last few weeks. Has continued HEP compliance with no adverse effects.   PERTINENT HISTORY:  None  PAIN:  Are you having pain?  Yes: NPRS scale: 0/10 Worst: 4/10 Pain location: lower back, bilaterally Pain description: sharp, N/T Aggravating factors: fwd bending,  lying on side Relieving factors: heat, positioning, medication  PRECAUTIONS: None  WEIGHT BEARING RESTRICTIONS: No  FALLS:  Has patient fallen in last 6 months? 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   IMPRESSION: L5-S1 small right paracentral extrusion impinging on the right S1 nerve root.  PATIENT SURVEYS:  FOTO: 72% function;  83% predicted 10/28/2022: 83% function  COGNITION: Overall cognitive status: Within functional limits for tasks assessed     SENSATION: Light touch: Impaired -   POSTURE: increased lumbar lordosis  PALPATION: No overt TTP to lumbar parapsinals  LUMBAR ROM:   AROM eval  Flexion 25% reduced with pain  Extension WNL  Right lateral flexion   Left lateral flexion   Right rotation WNL  Left rotation pain   (Blank rows = not tested)  LOWER EXTREMITY MMT:    MMT Right eval Left eval Right 10/28/22 Left 10/28/22  Hip flexion 4/5 4/5 5/5 5/5  Hip extension      Hip abduction 4/5 4/5 5/5 5/5  Hip adduction      Hip internal rotation      Hip external rotation      Knee flexion      Knee extension      Ankle dorsiflexion      Ankle plantarflexion      Ankle inversion      Ankle eversion       (Blank rows = not tested)  LUMBAR SPECIAL TESTS:  Straight leg raise test: Positive and Slump test: Positive  FUNCTIONAL TESTS:  90/90 hold: 35 seconds 10/28/2022: 60 seconds  GAIT: Distance walked: 68ft Assistive device utilized: None Level of assistance: Complete Independence Comments: no deviatoins  TREATMENT: [facility] Adult PT Treatment:                                                DATE: 10/28/2022 Therapeutic Exercise: Supine  sciatic nerve glide x 15 each Bridge x 15 black band S/L clamshell x 10 black band POE x 2 min Supine PPT x 10 - 5" hold 90/90 x 60" 90/90 heel taps 2x5 each  Birddog x 10 Therapeutic Activity: Assessment of tests/measures, goals, and outcomes for discharge  [facility] Adult PT Treatment:                                                DATE: 10/08/2022 Therapeutic Exercise: Supine sciatic nerve glide x 15 each Bridge 2x15 blue band S/L clamshell 2x10 blue band POE x 2 min Prone press up x 10 Supine PPT x 10 - 5" hold 90/90 2x30" 90/90 heel taps 2x5 each  Birddog 3x10 Pallof press 2x10 10#  [facility] Adult PT Treatment:                                                DATE: 09/18/2022 Therapeutic Exercise: POE x 60"  S/L hip x 5 Supine PPT x 5 - 5" hold 90/90 hold x 30"  Modified side plank x 30" Seated sciatica x 5 each   PATIENT EDUCATION:  Education details: final HEP Person educated: Patient Education method: Explanation, Demonstration, and Handouts Education comprehension: verbalized understanding and returned demonstration  HOME EXERCISE PROGRAM: Access Code: [email or web address] URL: [email or web address] Date: 10/28/2022 Prepared by: Edwinna Areola  Exercises - Seated Sciatic Tensioner  - 3 x weekly - 2 sets - 10 reps - Static Prone on Elbows  - 3 x weekly - 2 reps - 2  min hold - Supine Posterior Pelvic Tilt  - 3 x weekly - 2 sets - 10 reps - 5 sec hold - Supine 90/90 Abdominal Bracing  - 3 x weekly - 2 reps - 30 sec hold - Supine 90/90 Alternating Heel Touches with Posterior Pelvic Tilt  - 3 x weekly - 2 sets - 10 reps - Side Plank on Knees  - 3 x weekly - 2 reps - 30 sec hold - Sidelying Hip Abduction  - 3 x weekly - 3 sets - 10 reps - Clamshell with Resistance  - 3 x weekly - 2 sets - 10 reps - black band hold - Supine Bridge with Resistance Band  - 3 x weekly - 3 sets - 10 reps - black band hold - Bird Dog  - 3 x weekly - 3 sets - 10 reps  ASSESSMENT:  CLINICAL  IMPRESSION: Pt was able to complete all prescribed exercises and demonstrated knowledge of HEP with no adverse effect. Over the course of PT treatment he has progressed very well, greatly decreasing back pain and core/hip strength. He has met all LTGs including improvement in subjective functional ability assessed via FOTO. He should continue to improve with HEP compliance and is being discharged from skilled therapy at this time.   OBJECTIVE IMPAIRMENTS: decreased mobility, decreased strength, and pain.   ACTIVITY LIMITATIONS: standing and squatting  PARTICIPATION LIMITATIONS: community activity, occupation, and yard work  PERSONAL FACTORS: None  GOALS: Goals reviewed with patient? No  SHORT TERM GOALS: Target date: 10/09/2022   Pt will be compliant and knowledgeable with initial HEP for improved comfort and carryover Baseline: initial HEP given  Goal status: MET  2.  Pt will self report lower back pain no greater than 2/10 for improved comfort and functional ability Baseline: 4/10 at worst Goal status: MET   LONG TERM GOALS: Target date: 11/13/2022   Pt will improve FOTO function score to no less than 83% as proxy for functional improvement Baseline: 72% function 10/28/22: 83% function Goal status: MET   2.  Pt will self report lower back and bilateral LE pain no greater than 0/10 for improved comfort and functional ability Baseline: 4/10 at worst Goal status: MET   3.  Pt will increase 90/90 hold time to no less than 60 seconds for improved core endurance and functional stability Baseline: 35 seconds 10/28/2022: 60 seconds Goal status: MET   4.  Pt will improve bilateral LE MMT to no less than 5/5 for all tested motions for improved functional ability Baseline: see MMT chart Goal status: MET   PLAN:  PT FREQUENCY: 1x/week  PT DURATION: 6 weeks  PLANNED INTERVENTIONS: Therapeutic exercises, Therapeutic activity, Neuromuscular re-education, Balance training, Gait  training, Patient/Family education, Self Care, Joint mobilization, Dry Needling, Electrical stimulation, Cryotherapy, Moist heat, Vasopneumatic device, Manual therapy, and Re-evaluation.  PLAN FOR NEXT SESSION: assess HEP response, core and hip strengthening   Eloy End, PT 10/28/2022, 9:20 AM

## 2022-10-29 ENCOUNTER — Ambulatory Visit: Payer: BC Managed Care – PPO
